# Patient Record
Sex: Female | Born: 1965 | Race: White | Hispanic: No | Marital: Married | Smoking: Never smoker
Health system: Southern US, Community
[De-identification: ages and names within clinical notes are randomized; demographics above are authoritative.]

## PROBLEM LIST (undated history)

## (undated) DIAGNOSIS — M542 Cervicalgia: Secondary | ICD-10-CM

## (undated) DIAGNOSIS — M1990 Unspecified osteoarthritis, unspecified site: Secondary | ICD-10-CM

## (undated) DIAGNOSIS — IMO0002 Reserved for concepts with insufficient information to code with codable children: Secondary | ICD-10-CM

## (undated) DIAGNOSIS — Z8739 Personal history of other diseases of the musculoskeletal system and connective tissue: Secondary | ICD-10-CM

## (undated) HISTORY — DX: Personal history of other diseases of the musculoskeletal system and connective tissue: Z87.39

## (undated) HISTORY — PX: MOUTH SURGERY: SHX715

## (undated) HISTORY — PX: SPINE SURGERY: SHX786

## (undated) HISTORY — DX: Unspecified osteoarthritis, unspecified site: M19.90

## (undated) HISTORY — DX: Reserved for concepts with insufficient information to code with codable children: IMO0002

## (undated) HISTORY — DX: Cervicalgia: M54.2

---

## 1998-02-11 ENCOUNTER — Other Ambulatory Visit: Admission: RE | Admit: 1998-02-11 | Discharge: 1998-02-11 | Payer: Self-pay | Admitting: Obstetrics and Gynecology

## 1998-02-13 ENCOUNTER — Ambulatory Visit (HOSPITAL_COMMUNITY): Admission: RE | Admit: 1998-02-13 | Discharge: 1998-02-13 | Payer: Self-pay | Admitting: Obstetrics and Gynecology

## 2001-04-06 ENCOUNTER — Other Ambulatory Visit: Admission: RE | Admit: 2001-04-06 | Discharge: 2001-04-06 | Payer: Self-pay | Admitting: Family Medicine

## 2012-06-25 DIAGNOSIS — M549 Dorsalgia, unspecified: Secondary | ICD-10-CM | POA: Insufficient documentation

## 2012-09-13 ENCOUNTER — Other Ambulatory Visit: Payer: Self-pay | Admitting: Gastroenterology

## 2012-09-13 DIAGNOSIS — R109 Unspecified abdominal pain: Secondary | ICD-10-CM

## 2012-09-18 ENCOUNTER — Ambulatory Visit
Admission: RE | Admit: 2012-09-18 | Discharge: 2012-09-18 | Disposition: A | Discharge status: Home / Self Care | Payer: No Typology Code available for payment source | Source: Ambulatory Visit | Attending: Gastroenterology | Admitting: Gastroenterology

## 2012-09-18 DIAGNOSIS — R109 Unspecified abdominal pain: Secondary | ICD-10-CM

## 2012-09-18 MED ORDER — IOHEXOL 300 MG/ML  SOLN
100.0000 mL | Freq: Once | INTRAMUSCULAR | Status: AC | PRN
  Administered 2012-09-18: 100 mL via INTRAVENOUS

## 2012-12-27 ENCOUNTER — Ambulatory Visit: Payer: Self-pay | Admitting: Neurology

## 2013-01-04 ENCOUNTER — Ambulatory Visit: Payer: Self-pay | Admitting: Neurology

## 2013-01-16 ENCOUNTER — Ambulatory Visit (INDEPENDENT_AMBULATORY_CARE_PROVIDER_SITE_OTHER): Payer: Self-pay | Admitting: Neurology

## 2013-01-16 ENCOUNTER — Encounter: Payer: Self-pay | Admitting: Neurology

## 2013-01-16 VITALS — BP 117/74 | HR 82 | Temp 99.0°F | Ht 63.0 in | Wt 151.0 lb

## 2013-01-16 DIAGNOSIS — M542 Cervicalgia: Secondary | ICD-10-CM | POA: Insufficient documentation

## 2013-01-16 DIAGNOSIS — M5030 Other cervical disc degeneration, unspecified cervical region: Secondary | ICD-10-CM

## 2013-01-16 HISTORY — DX: Cervicalgia: M54.2

## 2013-01-16 NOTE — Patient Instructions (Addendum)
Neck pain patient ;  Exercises : do not lift over 12 pounds.   Muscle relaxers. You can continue to use  Flexeril .    Refer to EMG and NCS -  MRI  Pending neurosurgery evaluation.  At [facility] imaging due to payment plan.

## 2013-01-16 NOTE — Progress Notes (Signed)
[facility]  Provider:  Dr Vickey Huger Referring Provider: No ref. provider found Primary Care Physician:  Kathy Cirri, DO  Chief Complaint  Patient presents with  . New Evaluation    cervicalgia, paper referral, Verdie Drown, rm 11    HPI:  Kathy Lang is a 47 y.o. right handed , caucasian, married  female - seen here as a referral from Dr. Rexford Maus . The patient has seen this  physician in [facility] within the [facility] system for neck pain.  On 10/18/2012 x-rays of the C-spine were ordered in office ,  These   demonstrated that the patient had indeed degenerative disc disease, no fractures are noted ,but multilevel degenerative disc disease  is described most severe at between C5 and C6.  There is also spurring noted which narrows the right C5-C6 foramen . The study was read at [facility]  Imaging and Dr. Verdie Drown dictated and called the patient and explained  that the patient will be referred to neurosurgery.  He also stated that he had initiated a referral to [facility].   The patient and I are both surprised that she is today in the neurology sleep clinic, that she has definitely cervicalgia and there seems to be a bony anatomy is narrowing of the right C5-C6 for him and that could have caused her symptoms.  The patient has a past medical history of bursitis but not of arthritis. Past surgical history only of tooth extractions.  Family history her father had diabetes both parents had hypertension her father also had migraine headaches and cancer  of the lung.     Review of Systems: Out of a complete 14 system review, the patient complains of only the following symptoms, and all other reviewed systems are negative. Cervicalgia.   History   Social History  . Marital Status: Single    Spouse Name: N/A    Number of Children: 4  . Years of Education: 12   Occupational History  .      not employed   Social History Main Topics  . Smoking status:  Never Smoker   . Smokeless tobacco: Not on file  . Alcohol Use: No  . Drug Use: No  . Sexually Active: Not on file   Other Topics Concern  . Not on file   Social History Narrative  . No narrative on file    Family History  Problem Relation Age of Onset  . Hypertension Mother   . Diabetes Father   . Hypertension Father   . Migraines Father   . Cancer Maternal Grandmother     breast    Past Medical History  Diagnosis Date  . Personal history of other musculoskeletal disorders(V13.59)   . Osteoarthritis     hip  . DDD (degenerative disc disease)     Past Surgical History  Procedure Laterality Date  . Mouth surgery      tooth extraction    Current Outpatient Prescriptions  Medication Sig Dispense Refill  . Multiple Vitamin (MULTIVITAMIN) tablet Take 1 tablet by mouth daily.      . Omega-3 Fatty Acids (FISH OIL) 1000 MG CAPS Take by mouth daily.       No current facility-administered medications for this visit.    Allergies as of 01/16/2013 - Review Complete 01/16/2013  Allergen Reaction Noted  . Penicillins  01/16/2013    Vitals: BP 117/74  Pulse 82  Temp(Src) 99 F (37.2 C) (Oral)  Ht 5\' 3"  (1.6 m)  Wt  151 lb (68.493 kg)  BMI 26.76 kg/m2 Last Weight:  Wt Readings from Last 1 Encounters:  01/16/13 151 lb (68.493 kg)   Last Height:   Ht Readings from Last 1 Encounters:  01/16/13 5\' 3"  (1.6 m)     Physical exam:  General: The patient is awake, alert and appears not in acute distress. The patient is well groomed. Head: Normocephalic, atraumatic. Neck is supple. Mallampati 1 , intact oral cavity, no inflammatory changes . Neck circumference: 15 . No TMJ , no delayed swallowing , dysphagia.  Cardiovascular:  Regular rate and rhythm, without  murmurs or carotid bruit, and without distended neck veins. Respiratory: Lungs are clear to auscultation. Skin:  Without evidence of edema, or rash Trunk: BMI is elevated / patient  has normal  posture.  Neurologic exam : The patient is awake and alert, oriented to place and time.  Memory subjective  described as intact. There is a normal attention span & concentration ability. Speech is fluent without dysarthria, dysphonia or aphasia. Mood and affect are appropriate.  Cranial nerves: Pupils are equal and briskly reactive to light. Funduscopic exam without  evidence of pallor or edema. Extraocular movements  in vertical and horizontal planes intact and without nystagmus. Visual fields by finger perimetry are intact. Hearing to finger rub intact.  Facial sensation intact to fine touch. Facial motor strength is symmetric and tongue and uvula move midline.  Motor exam:   Normal tone and normal muscle bulk and symmetric  strength in all extremities. She  Has noted weakness in grip strength bilaterally . Indeed her grip was right a little weaker than left. No finger numbness or weakness.   Her biceps flexion on the right is weaker.   Sensory:  Fine touch, pinprick and vibration were tested in all extremities. The patient reports a numb area over the right d shoulder blade up to the  paraspinal level C6 on the right.   Proprioception is tested in the upper extremities only. This was  normal.  Coordination: Rapid alternating movements in the fingers/hands is tested and normal. Finger-to-nose maneuver tested and normal without evidence of ataxia, dysmetria or tremor.  Gait and station: Patient walks without assistive device  to the exam table. Strength within normal limits. Stance is stable and normal. Tandem gait unfragmented. Romberg testing is normal.  Deep tendon reflexes: in the  upper and lower extremities intact. The right patella was brisker than left, achilles is equal. Babinski maneuver response is bilaterally downgoing.   Assessment:  After physical and neurologic examination, review of laboratory studies, imaging, neurophysiology testing and pre-existing records, assessment will be  reviewed on the problem list.  Plan:  Treatment plan and additional workup will be reviewed under Problem List.   The patient will be referred for a EMG and NCS of the upper extremities and MRI cervical spine.  She is uninsured and will need to make a payment plan.

## 2013-01-19 ENCOUNTER — Telehealth: Payer: Self-pay | Admitting: Neurology

## 2013-01-19 NOTE — Telephone Encounter (Signed)
She was under the impression that she was going to be referred for an MRI but got a call from a neurosurgeons office.  She is a little confused.  Please call.

## 2013-01-22 NOTE — Telephone Encounter (Signed)
Voice message left requesting a call-back. 

## 2013-01-23 NOTE — Telephone Encounter (Signed)
Pt stated that Dr Bluford Kaufmann' referral should have been for neurology and not a neurosurgeon.  She does not want to see a neurosurgeon at this time but possibly after she has had her MRI and NCS.  Pt is self-insured and requested that MRI be done at [facility] due to cost however she was referred to [facility] for her MRI: I transferred her to [facility] so that she may get more information regarding an MRI done at [facility].

## 2013-01-23 NOTE — Telephone Encounter (Signed)
Please call patient, she had been referred to NS per dr Bluford Kaufmann, the order was than changed by hand to neurology. I ordered both, MRI at [facility] imaging due to costs( she has a payment plan with the hospital ) and  NS consult.

## 2013-01-26 ENCOUNTER — Ambulatory Visit (INDEPENDENT_AMBULATORY_CARE_PROVIDER_SITE_OTHER): Payer: Self-pay | Admitting: Neurology

## 2013-01-26 ENCOUNTER — Encounter (INDEPENDENT_AMBULATORY_CARE_PROVIDER_SITE_OTHER): Payer: Self-pay

## 2013-01-26 DIAGNOSIS — R202 Paresthesia of skin: Secondary | ICD-10-CM | POA: Insufficient documentation

## 2013-01-26 DIAGNOSIS — Z0289 Encounter for other administrative examinations: Secondary | ICD-10-CM

## 2013-01-26 DIAGNOSIS — R209 Unspecified disturbances of skin sensation: Secondary | ICD-10-CM

## 2013-01-26 DIAGNOSIS — M5030 Other cervical disc degeneration, unspecified cervical region: Secondary | ICD-10-CM

## 2013-01-26 DIAGNOSIS — M542 Cervicalgia: Secondary | ICD-10-CM

## 2013-01-26 NOTE — Procedures (Signed)
    [facility]  NCS (NERVE CONDUCTION STUDY) WITH EMG (ELECTROMYOGRAPHY) REPORT   STUDY DATE: June 20th 2014 PATIENT NAME: Kathy Lang DOB: Jun 05, 1966 MRN: [id number]    TECHNOLOGIST: Gearldine Shown ELECTROMYOGRAPHER: Levert Feinstein M.D.  CLINICAL INFORMATION:   47 years old right-handed Caucasian female, with neck pain, since December 2013, radiating to right shoulder, and arm,   Examinations: Bilateral upper and lower extremity motor strength was normal, deep tendon reflexes were brisk and symmetric.  FINDINGS: NERVE CONDUCTION STUDY: Bilateral median, ulnar sensory and motor responses were normal.  NEEDLE ELECTROMYOGRAPHY: Selected needle examination was performed at right upper extremity muscles, right cervical paraspinals.  Needle examination of right pronator teres, brachioradialis, biceps, triceps, deltoid, extensor digitorum communis was normal.  There was no spontaneous activity at right cervical paraspinal muscles, right C5, C6, C7  IMRESSION: This is a normal study. There is no electrodiagnostic evidence of right upper extremity neuropathy, or right cervical radiculopathy.   INTERPRETING PHYSICIAN:   Levert Feinstein M.D. Ph.D. [facility] [address], [address] [place], [place] [address] [phone]

## 2013-01-29 ENCOUNTER — Encounter: Payer: Self-pay | Admitting: Neurology

## 2013-01-29 NOTE — Progress Notes (Signed)
Quick Note:  Please call with normal result. ______

## 2013-01-30 ENCOUNTER — Telehealth: Payer: Self-pay

## 2013-01-30 NOTE — Telephone Encounter (Signed)
Tried calling patient w/ NCV/EMG results. HP# busy, Mobile# does not id patient. Will try later.

## 2013-01-30 NOTE — Telephone Encounter (Signed)
Message copied by Doree Barthel on Tue Jan 30, 2013 10:38 AM ------      Message from: [facility], CARMEN      Created: Mon Jan 29, 2013 12:05 PM       Please call with normal result. ------

## 2013-01-31 ENCOUNTER — Telehealth: Payer: Self-pay

## 2013-01-31 NOTE — Telephone Encounter (Signed)
Called patient. Gave results

## 2013-01-31 NOTE — Telephone Encounter (Signed)
Message copied by Doree Barthel on Wed Jan 31, 2013  1:27 PM ------      Message from: [facility], CARMEN      Created: Mon Jan 29, 2013 12:05 PM       Please call with normal result. ------

## 2013-02-01 ENCOUNTER — Ambulatory Visit
Admission: RE | Admit: 2013-02-01 | Discharge: 2013-02-01 | Disposition: A | Discharge status: Home / Self Care | Payer: No Typology Code available for payment source | Source: Ambulatory Visit | Attending: Neurology | Admitting: Neurology

## 2013-02-01 DIAGNOSIS — M542 Cervicalgia: Secondary | ICD-10-CM

## 2013-02-01 DIAGNOSIS — M5030 Other cervical disc degeneration, unspecified cervical region: Secondary | ICD-10-CM

## 2013-02-06 ENCOUNTER — Telehealth: Payer: Self-pay | Admitting: Neurology

## 2013-02-06 NOTE — Telephone Encounter (Signed)
Bone spur affecting the right C5-6 nerve exit foramen.  Please call and ask her if she wants to try injectio therapy , PT or referral to neurosurgeon.

## 2013-02-07 ENCOUNTER — Telehealth: Payer: Self-pay | Admitting: Neurology

## 2013-02-08 ENCOUNTER — Other Ambulatory Visit: Payer: Self-pay | Admitting: Family Medicine

## 2013-02-08 DIAGNOSIS — M542 Cervicalgia: Secondary | ICD-10-CM

## 2013-02-08 NOTE — Telephone Encounter (Signed)
Spoke to patient. Explained Dr. Gay Filler previous note. Patient says she will do more research on treatment options and then call us back. Request MRI report mailed.

## 2013-02-12 ENCOUNTER — Ambulatory Visit
Admission: RE | Admit: 2013-02-12 | Discharge: 2013-02-12 | Disposition: A | Discharge status: Home / Self Care | Payer: No Typology Code available for payment source | Source: Ambulatory Visit | Attending: Family Medicine | Admitting: Family Medicine

## 2013-02-12 DIAGNOSIS — M542 Cervicalgia: Secondary | ICD-10-CM

## 2013-02-28 ENCOUNTER — Encounter (INDEPENDENT_AMBULATORY_CARE_PROVIDER_SITE_OTHER): Payer: Self-pay | Admitting: General Surgery

## 2013-03-05 NOTE — Telephone Encounter (Signed)
Previous note says patient will explore options and then get back to [place].

## 2013-12-06 DIAGNOSIS — M7070 Other bursitis of hip, unspecified hip: Secondary | ICD-10-CM | POA: Insufficient documentation

## 2014-02-28 ENCOUNTER — Other Ambulatory Visit: Payer: Self-pay | Admitting: Orthopedic Surgery

## 2014-02-28 DIAGNOSIS — M545 Low back pain, unspecified: Secondary | ICD-10-CM

## 2014-03-05 ENCOUNTER — Ambulatory Visit
Admission: RE | Admit: 2014-03-05 | Discharge: 2014-03-05 | Disposition: A | Discharge status: Home / Self Care | Payer: No Typology Code available for payment source | Source: Ambulatory Visit | Attending: Orthopedic Surgery | Admitting: Orthopedic Surgery

## 2014-03-05 DIAGNOSIS — M545 Low back pain, unspecified: Secondary | ICD-10-CM

## 2016-02-04 DIAGNOSIS — M5000 Cervical disc disorder with myelopathy, unspecified cervical region: Secondary | ICD-10-CM | POA: Insufficient documentation

## 2016-03-08 DIAGNOSIS — M818 Other osteoporosis without current pathological fracture: Secondary | ICD-10-CM | POA: Insufficient documentation

## 2017-09-06 DIAGNOSIS — R159 Full incontinence of feces: Secondary | ICD-10-CM | POA: Insufficient documentation

## 2017-09-06 DIAGNOSIS — R152 Fecal urgency: Secondary | ICD-10-CM | POA: Insufficient documentation

## 2017-09-08 ENCOUNTER — Other Ambulatory Visit: Payer: Self-pay | Admitting: Orthopedic Surgery

## 2017-09-08 DIAGNOSIS — M542 Cervicalgia: Secondary | ICD-10-CM

## 2017-09-14 ENCOUNTER — Ambulatory Visit
Admission: RE | Admit: 2017-09-14 | Discharge: 2017-09-14 | Disposition: A | Discharge status: Home / Self Care | Payer: No Typology Code available for payment source | Source: Ambulatory Visit | Attending: Orthopedic Surgery | Admitting: Orthopedic Surgery

## 2017-09-14 DIAGNOSIS — M542 Cervicalgia: Secondary | ICD-10-CM

## 2017-10-10 DIAGNOSIS — K529 Noninfective gastroenteritis and colitis, unspecified: Secondary | ICD-10-CM | POA: Insufficient documentation

## 2019-12-15 IMAGING — CT CT CERVICAL SPINE W/O CM
2 series · 10 of 14 positions shown, 12 images · non-contrast
Comparison: Cervical spine x-rays 01/26/2017.  MRI 01/16/2016

CLINICAL DATA: Cervicalgia.  Cervical fusion 5284.

EXAM:
CT CERVICAL SPINE WITHOUT CONTRAST
TECHNIQUE: Multidetector CT imaging of the cervical spine was performed without
intravenous contrast. Multiplanar CT image reconstructions were also
generated.

[Series 4: c_spine 2.0 i41s 3 · axial · 0.25mm/px · z∈[-222,-94]mm · 5 of 98 slices shown, 7 images]
[im 17/98  soft-tissue]
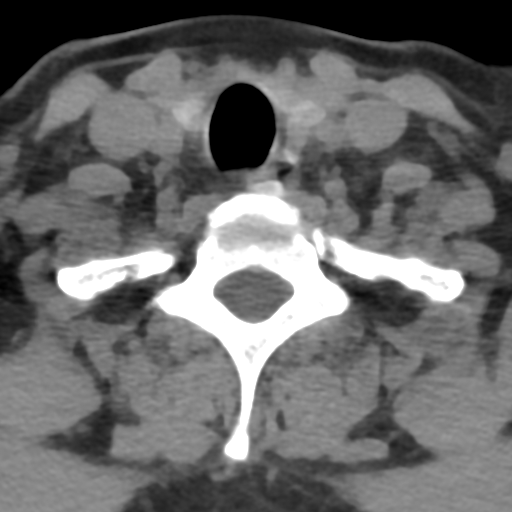
[im 17/98  bone]
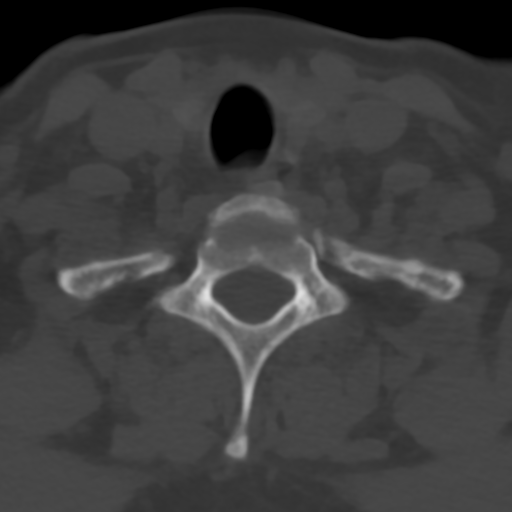
[im 33/98  bone]
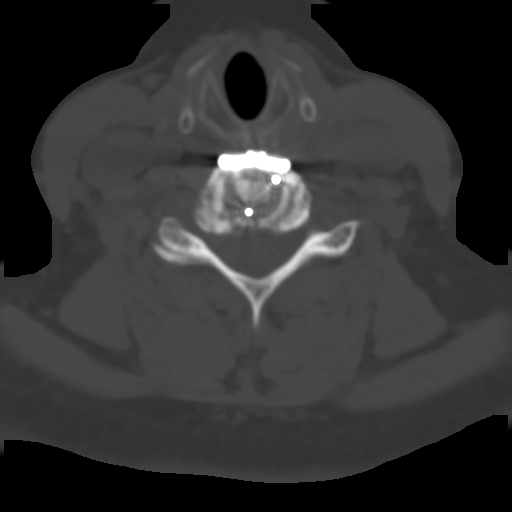
[im 49/98  bone]
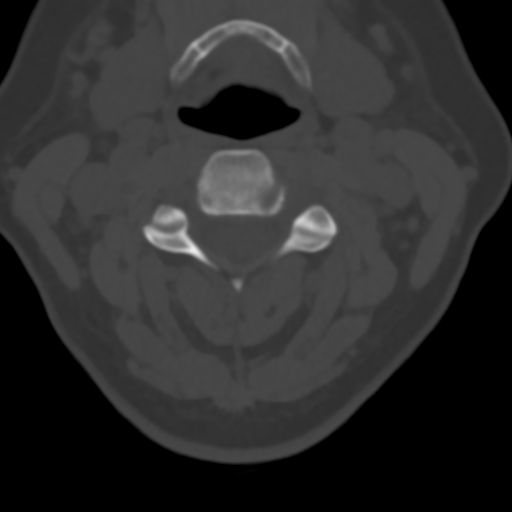
[im 65/98  bone]
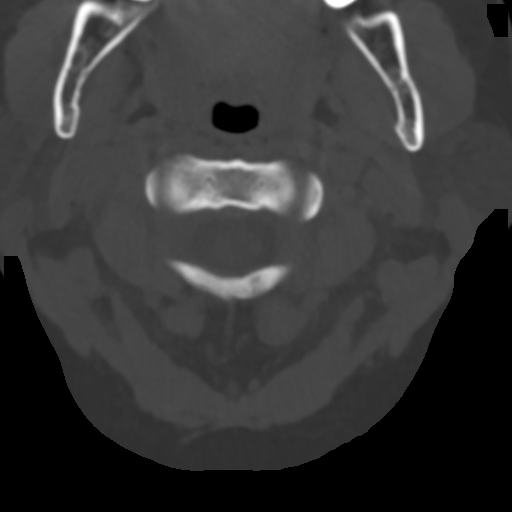
[im 81/98  soft-tissue]
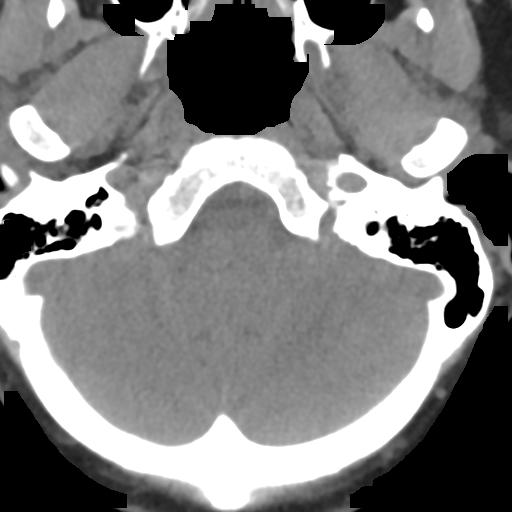
[im 81/98  bone]
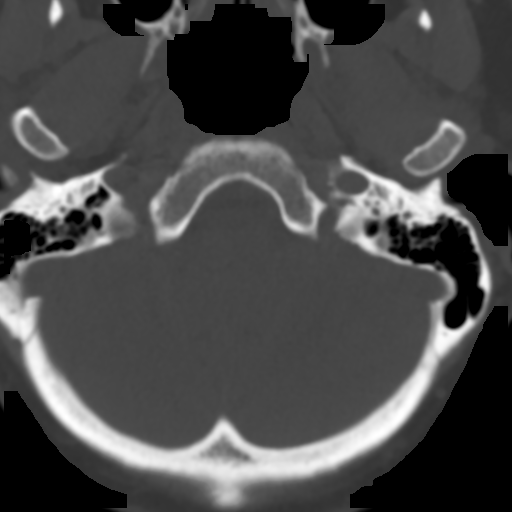

[Series 12: angled axials · axial · 0.23mm/px · z∈[-234,-107]mm · 5 of 99 slices shown]
[im 17/99  bone]
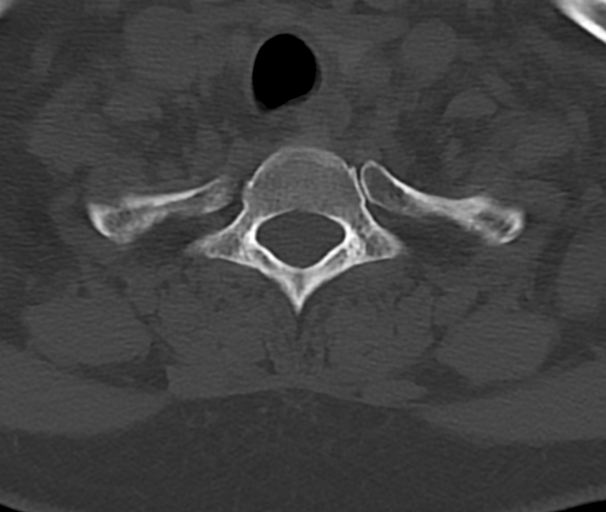
[im 33/99  bone]
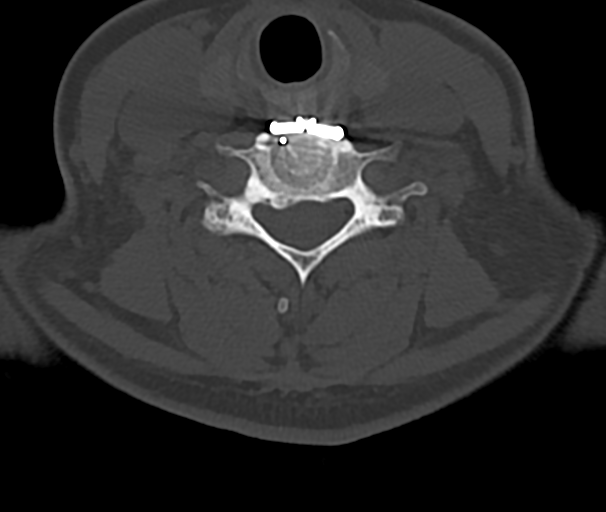
[im 50/99  bone]
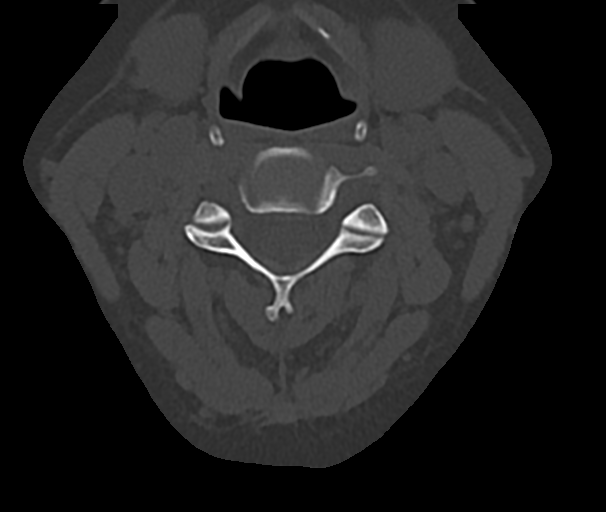
[im 66/99  bone]
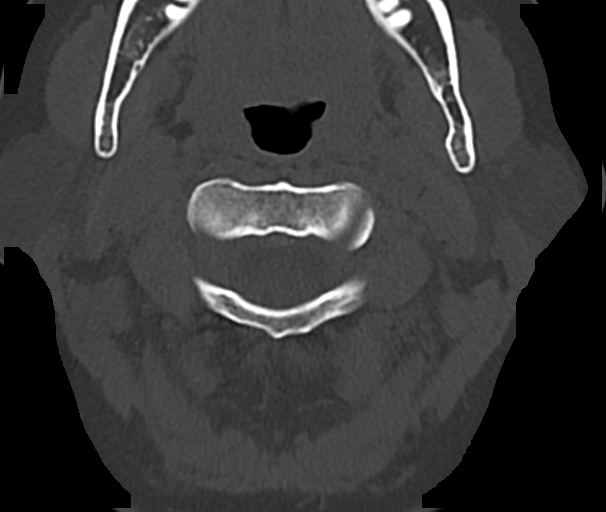
[im 82/99  bone]
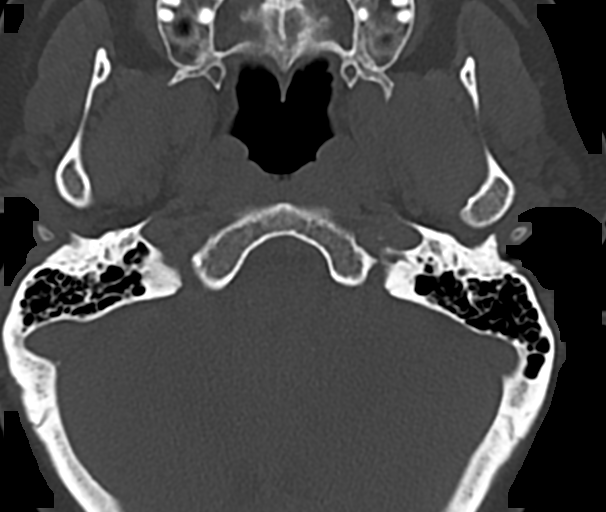

[10 of 14 positions shown; findings below may reference images not displayed]

FINDINGS: Alignment: Normal

Skull base and vertebrae: Negative for fracture or mass.

Soft tissues and spinal canal: Negative for mass or adenopathy in
the neck.

Disc levels:  C2-3: Negative

C3-4: Negative

C4-5: Negative

C5-6: ACDF. Anterior plate and screws in good position. Persistent
lucency through the bone graft compatible with pseudarthrosis.
Moderate right foraminal encroachment due to a large osteophyte.
Mild left foraminal narrowing due to spurring.

C6-7: Small central disc protrusion. Mild uncinate spurring
diffusely with mild foraminal stenosis bilaterally.

C7-T1: Negative

Upper chest: Negative

Other: None
IMPRESSION: ACDF C5-6 with pseudarthrosis. Large right-sided osteophyte causing
moderate right foraminal encroachment and mild left foraminal
encroachment

Small central disc protrusion C6-7 unchanged from the prior MRI.
Mild foraminal narrowing bilaterally due to spurring also unchanged
from the prior MRI.

## 2020-03-21 DIAGNOSIS — M5134 Other intervertebral disc degeneration, thoracic region: Secondary | ICD-10-CM | POA: Insufficient documentation

## 2020-08-20 ENCOUNTER — Telehealth: Payer: Self-pay

## 2020-08-21 ENCOUNTER — Other Ambulatory Visit: Payer: Self-pay

## 2020-08-21 ENCOUNTER — Inpatient Hospital Stay: Payer: Self-pay | Attending: Hematology and Oncology | Admitting: Hematology and Oncology

## 2020-08-21 VITALS — BP 104/67 | HR 67 | Temp 98.6°F | Resp 18 | Wt 153.3 lb

## 2020-08-21 DIAGNOSIS — Z1239 Encounter for other screening for malignant neoplasm of breast: Secondary | ICD-10-CM

## 2020-08-21 NOTE — Progress Notes (Signed)
Ms. Kathy Lang is a 55 y.o. female who presents to [facility] clinic today with no complaints .    Pap Smear: Pap not smear completed today. Last Pap smear was 2019 at [facility] clinic and was normal. Per patient has no history of an abnormal Pap smear. Last Pap smear result is not available in Epic.   Physical exam: Breasts Breasts symmetrical. No skin abnormalities bilateral breasts. No nipple retraction bilateral breasts. No nipple discharge bilateral breasts. No lymphadenopathy. No lumps palpated bilateral breasts.       Pelvic/Bimanual Pap is not indicated today    Smoking History: Patient has never smoked     Patient Navigation: Patient education provided. Access to services provided for patient through [facility] program.    Colorectal Cancer Screening: Per patient has had colonoscopy completed on 2019 No complaints today.    Breast and Cervical Cancer Risk Assessment: Patient does not have family history of breast cancer, known genetic mutations, or radiation treatment to the chest before age 60. Patient does not have history of cervical dysplasia, immunocompromised, or DES exposure in-utero.  Risk Assessment    Risk Scores      08/21/2020   Last edited by: Dyane Dustman, RN   5-year risk:    Lifetime risk:           A: [facility] exam without pap smear Taught [facility] HOW[employer]ER how to perform BSE and gave educational materials to take home. Patient did not need a Pap smear today due to last Pap smear was in 2019 per patient. Told patient about free cervical cancer screenings to receive a Pap smear if would like one next year. Let her know [facility] will cover Pap smears every 3 years unless has a history of abnormal Pap smears. Referred patient to [facility] for diagnostic mammogram.  Let patient know will follow up with her within the next couple weeks with results. Kathy Lang verbalized understanding.  Pascal Lux, NP 08/21/20 3:41  PM      P: Referred patient to the Breast Center  for a screening mammogram.   Pascal Lux, NP 08/21/2020 3:41 PM

## 2020-09-04 NOTE — Progress Notes (Unsigned)
Pt received a screening mammogram on 08/29/2020 at [facility] through the [facility] program with negative results. Results are mailed directly to the patient.

## 2020-09-06 ENCOUNTER — Encounter: Payer: Self-pay | Admitting: Hematology and Oncology

## 2020-09-19 ENCOUNTER — Ambulatory Visit
Admission: RE | Admit: 2020-09-19 | Discharge: 2020-09-19 | Disposition: A | Discharge status: Home / Self Care | Payer: Self-pay | Source: Ambulatory Visit | Attending: Family Medicine | Admitting: Family Medicine

## 2020-09-19 ENCOUNTER — Other Ambulatory Visit: Payer: Self-pay

## 2020-09-19 ENCOUNTER — Other Ambulatory Visit: Payer: Self-pay | Admitting: Family Medicine

## 2020-09-19 DIAGNOSIS — R059 Cough, unspecified: Secondary | ICD-10-CM

## 2020-09-19 DIAGNOSIS — M549 Dorsalgia, unspecified: Secondary | ICD-10-CM

## 2020-09-30 ENCOUNTER — Ambulatory Visit: Payer: Self-pay | Admitting: Physician Assistant

## 2020-12-01 ENCOUNTER — Encounter: Payer: Self-pay | Admitting: Physician Assistant

## 2020-12-01 ENCOUNTER — Ambulatory Visit (INDEPENDENT_AMBULATORY_CARE_PROVIDER_SITE_OTHER): Payer: Self-pay | Admitting: Physician Assistant

## 2020-12-01 ENCOUNTER — Other Ambulatory Visit: Payer: Self-pay

## 2020-12-01 VITALS — BP 136/84 | HR 84 | Temp 97.3°F | Ht 62.0 in | Wt 160.0 lb

## 2020-12-01 DIAGNOSIS — M546 Pain in thoracic spine: Secondary | ICD-10-CM

## 2020-12-01 DIAGNOSIS — R6882 Decreased libido: Secondary | ICD-10-CM

## 2020-12-01 DIAGNOSIS — G8929 Other chronic pain: Secondary | ICD-10-CM

## 2020-12-01 MED ORDER — MELOXICAM 7.5 MG PO TABS: 7.5000 mg | ORAL_TABLET | Freq: Every day | ORAL | 3 refills | Status: DC

## 2020-12-01 NOTE — Progress Notes (Signed)
New Patient Office Visit  Subjective:  Patient ID: Kathy Lang, female    DOB: [birth year]  Age: 55 y.o. MRN: [id number]  CC:  Chief Complaint  Patient presents with  . Decreased libido Thoracic back pain    HPI Kathy Lang presents for decreased libido - she states that she has had this issue for more than 10 years - her husband told her to 'get hormones checked' Pt states to me and nurse her reasoning for decreased libido is due to their poor relationship She actually has been postmenopausal for 5 years which she is told this could be the issue and would not necessarily checking hormone levels (because pt states she did not want to take hormone replacement therapy) but is agreeable to check TSH Patient mentions her vagina appears darker than it used to be and wanted checked  Pt states she has had mid thoracic back pain for the past year - she has seen ortho and had MRI of thoracic spine which she states showed mild disc bulging but no stenosis - complains of pain in mid back with certain movements  Past Medical History:  Diagnosis Date  . Cervicalgia 01/16/2013   Dr Carin Hock ENT referred to [facility] for neurosurgical evaluation.   . DDD (degenerative disc disease)   . Osteoarthritis    hip  . Personal history of other musculoskeletal disorders(V13.59)     Past Surgical History:  Procedure Laterality Date  . MOUTH SURGERY     tooth extraction  . SPINE SURGERY      Family History  Problem Relation Age of Onset  . Hypertension Mother   . Diabetes Father   . Hypertension Father   . Migraines Father   . Cancer Maternal Grandmother        breast    Social History   Socioeconomic History  . Marital status: Married    Spouse name: Kathy Lang  . Number of children: 4  . Years of education: 23  . Highest education level: Not on file  Occupational History    Comment: not employed  Tobacco Use  . Smoking status: Never Smoker  . Smokeless tobacco: Never Used  Substance  and Sexual Activity  . Alcohol use: No  . Drug use: No  . Sexual activity: Not on file  Other Topics Concern  . Not on file  Social History Narrative  . Not on file   Social Determinants of Health   Financial Resource Strain: Not on file  Food Insecurity: Not on file  Transportation Needs: Not on file  Physical Activity: Not on file  Stress: Not on file  Social Connections: Not on file  Intimate Partner Violence: Not on file     Current Outpatient Medications:  .  meloxicam (MOBIC) 7.5 MG tablet, Take 1 tablet (7.5 mg total) by mouth daily., Disp: 30 tablet, Rfl: 3 .  ZINC ACETATE EX, Apply topically., Disp: , Rfl:  .  Zinc Acetate, Oral, (ZINC ACETATE PO), Take by mouth., Disp: , Rfl:  .  ascorbic acid (VITAMIN C) 1000 MG tablet, Take by mouth., Disp: , Rfl:  .  Cholecalciferol 75 MCG (3000 UT) TABS, Take by mouth., Disp: , Rfl:  .  Multiple Vitamin (MULTIVITAMIN) tablet, Take 1 tablet by mouth daily., Disp: , Rfl:  .  Omega-3 Fatty Acids (FISH OIL) 1000 MG CAPS, Take by mouth daily., Disp: , Rfl:    Allergies  Allergen Reactions  . Penicillins     Swelling  of hands and feet    ROS CONSTITUTIONAL: see HPI CARDIOVASCULAR: Negative for chest pain, dizziness, palpitations and pedal edema.  RESPIRATORY: Negative for recent cough and dyspnea.  GASTROINTESTINAL: Negative for abdominal pain, acid reflux symptoms, constipation, diarrhea, nausea and vomiting.  GU - see HPI MSK: see HPI INTEGUMENTARY: Negative for rash.         Objective:    PHYSICAL EXAM:   VS: BP 136/84   Pulse 84   Temp (!) 97.3 F (36.3 C)   Ht 5' 2"  (1.575 m)   Wt 160 lb (72.6 kg)   LMP 08/23/2012   SpO2 99%   BMI 29.26 kg/m   GEN: Well nourished, well developed, in no acute distress  Cardiac: RRR; no murmurs, rubs, or gallops,no edema - no significant varicosities Respiratory:  normal respiratory rate and pattern with no distress - normal breath sounds with no rales, rhonchi, wheezes or  rubs MS: no deformity or atrophy - tender to right side back (thoracic) Skin: warm and dry, no rash - ext genitalia has generalized darkening (hormonal changes) and one small area that appears to be healing excoriated area Psych: euthymic mood, appropriate affect and demeanor  BP 136/84   Pulse 84   Temp (!) 97.3 F (36.3 C)   Ht 5' 2"  (1.575 m)   Wt 160 lb (72.6 kg)   LMP 08/23/2012   SpO2 99%   BMI 29.26 kg/m  Wt Readings from Last 3 Encounters:  12/01/20 160 lb (72.6 kg)  08/21/20 153 lb 4.8 oz (69.5 kg)  01/16/13 151 lb (68.5 kg)     Health Maintenance Due  Topic Date Due  . Hepatitis C Screening  Never done  . HIV Screening  Never done  . TETANUS/TDAP  Never done  . PAP SMEAR-Modifier  Never done  . COLONOSCOPY (Pts 45-49yr Insurance coverage will need to be confirmed)  Never done    There are no preventive care reminders to display for this patient.  No results found for: TSH No results found for: WBC, HGB, HCT, MCV, PLT No results found for: NA, K, CHLORIDE, CO2, GLUCOSE, BUN, CREATININE, BILITOT, ALKPHOS, AST, ALT, PROT, ALBUMIN, CALCIUM, ANIONGAP, EGFR, GFR No results found for: CHOL No results found for: HDL No results found for: LDLCALC No results found for: TRIG No results found for: CHOLHDL No results found for: HGBA1C    Assessment & Plan:   Problem List Items Addressed This Visit      Other   Decreased libido - Primary Order given for TSH Also mention to follow up if excoriated area in vaginal area does not clear in 2-3 weeks   Chronic right-sided thoracic back pain       Meds ordered this encounter  Medications  . meloxicam (MOBIC) 7.5 MG tablet    Sig: Take 1 tablet (7.5 mg total) by mouth daily.    Dispense:  30 tablet    Refill:  3    Order Specific Question:   Supervising Provider    Answer:Shelton Silvas   Follow-up: Return if symptoms worsen or fail to improve.    SARA R Rovena Hearld, PA-C

## 2021-02-10 LAB — TSH: TSH: 0.99 (ref <=5.90)

## 2021-02-16 ENCOUNTER — Encounter: Payer: Self-pay | Admitting: Physician Assistant

## 2021-02-19 ENCOUNTER — Encounter: Payer: Self-pay | Admitting: Physician Assistant

## 2021-05-07 ENCOUNTER — Ambulatory Visit: Payer: Self-pay | Admitting: Podiatry

## 2021-05-19 ENCOUNTER — Other Ambulatory Visit: Payer: Self-pay

## 2021-05-19 ENCOUNTER — Ambulatory Visit (INDEPENDENT_AMBULATORY_CARE_PROVIDER_SITE_OTHER): Payer: Self-pay

## 2021-05-19 ENCOUNTER — Encounter: Payer: Self-pay | Admitting: Sports Medicine

## 2021-05-19 ENCOUNTER — Ambulatory Visit (INDEPENDENT_AMBULATORY_CARE_PROVIDER_SITE_OTHER): Payer: Self-pay | Admitting: Sports Medicine

## 2021-05-19 DIAGNOSIS — M79671 Pain in right foot: Secondary | ICD-10-CM

## 2021-05-19 DIAGNOSIS — M79672 Pain in left foot: Secondary | ICD-10-CM

## 2021-05-19 DIAGNOSIS — R202 Paresthesia of skin: Secondary | ICD-10-CM

## 2021-05-19 DIAGNOSIS — M779 Enthesopathy, unspecified: Secondary | ICD-10-CM

## 2021-05-19 DIAGNOSIS — M792 Neuralgia and neuritis, unspecified: Secondary | ICD-10-CM

## 2021-05-19 DIAGNOSIS — M542 Cervicalgia: Secondary | ICD-10-CM

## 2021-05-19 DIAGNOSIS — M5440 Lumbago with sciatica, unspecified side: Secondary | ICD-10-CM

## 2021-05-19 MED ORDER — PREDNISONE 10 MG (21) PO TBPK: ORAL_TABLET | ORAL | 0 refills | Status: DC

## 2021-05-19 NOTE — Progress Notes (Signed)
Subjective: Kathy Lang is a 55 y.o. female patient who presents to office for evaluation of bilateral foot pain states that she gets burning and stinging sensations to the balls of both feet and to all toes 1 through 10 states that she has a similar sensation at both heels and ankles and states that there is a achy pain worse on the left ankle compared to the right with bruising and discoloration states that her feet are also very cold and she feels sharp shooting pain that feels like she is walking on rocks worse with the first step and tingling constantly through her toes even with rest has tried over-the-counter insoles, CBD cream, stretching and states that things have slowly gotten worse over the last 3 to 4 months and states that her back pain has gotten worse over the past year.  Patient denies any other pedal complaints.  Patient denies any significant past or family history of any types of inflammatory arthritis this.  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     Cholecalciferol 75 MCG (3000 UT) TABS Take by mouth.     cyclobenzaprine (FLEXERIL) 10 MG tablet Take by mouth.     meloxicam (MOBIC) 7.5 MG tablet Take 1 tablet (7.5 mg total) by mouth daily. 30 tablet 3   ZINC ACETATE EX Apply topically.     Zinc Acetate, Oral, (ZINC ACETATE PO) Take by mouth.     No current facility-administered  medications on file prior to visit.    Allergies  Allergen Reactions   Penicillins     Swelling of hands and feet    Objective:  General: Alert and oriented x3 in no acute distress  Dermatology: No open lesions bilateral lower extremities, no webspace macerations, no ecchymosis bilateral, all nails x 10 are well manicured.  Vascular: Dorsalis Pedis and Posterior Tibial pedal pulses palpable, Capillary Fill Time 3 seconds,(+) pedal hair growth bilateral, no edema bilateral lower extremities, Temperature gradient within normal limits.  Neurology: Michaell Cowing sensation intact via light touch bilateral.  Protective sensation present bilateral.  Subjective tingling sensation to all toes bilateral.  Musculoskeletal: Mild tenderness with palpation at ball greater than the heels of both feet with most pain with medial to lateral compression at the first and fifth MPJs bilateral.  There is also pain to the dorsal lateral foot and ankle left greater than right.  Strength appears to be within normal limits.  No other acute symptomatic bony deformities noted bilateral.  Xrays  Right and left foot   Impression: No acute osseous findings  Assessment and Plan: Problem List Items Addressed This Visit       Other   Cervicalgia   Paresthesia   Back pain   Relevant Medications   cyclobenzaprine (FLEXERIL) 10 MG tablet   predniSONE (STERAPRED UNI-PAK 21 TAB) 10 MG (21) TBPK tablet   Other Visit Diagnoses     Bilateral foot pain    -  Primary   Relevant Orders   DG Foot Complete Right   DG Foot Complete Left   Capsulitis       Tendinitis       Neuritis            -Complete examination performed -Xrays reviewed -Discussed treatment options foot pain possible related to neuritis versus capsulitis tendinitis -Rx prednisone for patient to take as directed -Advised patient to try over-the-counter Nervive nerve relief -Continue with good supportive shoes and OTC insoles -Continue with daily  stretching as directed  -Patient to return to office as needed or sooner if condition worsens. Advised patient since is self pay if she prefers to do a free one time virtual visit I am willing to do that to further discuss her symptoms if no relief from what I have recommended.  Asencion Islam, DPM

## 2021-05-19 NOTE — Patient Instructions (Signed)
Nervive nerve relief supplement for your nerves can be purchased OTC at [facility]  

## 2021-05-22 ENCOUNTER — Other Ambulatory Visit: Payer: Self-pay | Admitting: Sports Medicine

## 2021-05-22 DIAGNOSIS — M779 Enthesopathy, unspecified: Secondary | ICD-10-CM

## 2021-06-05 ENCOUNTER — Encounter: Payer: Self-pay | Admitting: Sports Medicine

## 2021-06-05 ENCOUNTER — Ambulatory Visit (INDEPENDENT_AMBULATORY_CARE_PROVIDER_SITE_OTHER): Payer: Self-pay | Admitting: Sports Medicine

## 2021-06-05 DIAGNOSIS — M79671 Pain in right foot: Secondary | ICD-10-CM

## 2021-06-05 DIAGNOSIS — M779 Enthesopathy, unspecified: Secondary | ICD-10-CM

## 2021-06-05 DIAGNOSIS — M792 Neuralgia and neuritis, unspecified: Secondary | ICD-10-CM

## 2021-06-05 DIAGNOSIS — R202 Paresthesia of skin: Secondary | ICD-10-CM

## 2021-06-05 DIAGNOSIS — M5440 Lumbago with sciatica, unspecified side: Secondary | ICD-10-CM

## 2021-06-05 DIAGNOSIS — M79672 Pain in left foot: Secondary | ICD-10-CM

## 2021-06-05 NOTE — Progress Notes (Signed)
Virtual Visit via Telephone Note  I connected with MALKY RUDZINSKI on 06/05/21 at  8:00 AM EDT by telephone and verified that I am speaking with the correct person using two identifiers.  Location: Patient: [facility] home Provider: Landis Martins, DPM Marrianne Mood    I discussed the limitations, risks, security and privacy concerns of performing an evaluation and management service by telephone and the availability of in person appointments. I also discussed with the patient that there may be a patient responsible charge related to this service. The patient expressed understanding and agreed to proceed.   History of Present Illness: 55 y/o F met via telephone visit to re-discuss her foot pain. Patient reports that pain was better after day 3 of the steroid pack but slowly the burning pain to both feet has returned.  Patient denies any other new pedal complaints at this time.   Observations/Objective: Physical exam unable to be performed due to telephone nature of visit.  Assessment and Plan: Problem List Items Addressed This Visit       Other   Paresthesia   Back pain   Other Visit Diagnoses     Neuritis    -  Primary   Capsulitis       Tendinitis       Bilateral foot pain          Advised patient since her symptoms have failed to improve may benefit from reevaluation of her back with her orthopedic or neurosurgeon or may benefit from a referral to neurology.  Patient wants to think about these options and will call office back when she has made a decision.  Follow Up Instructions: As above   I discussed the assessment and treatment plan with the patient. The patient was provided an opportunity to ask questions and all were answered. The patient agreed with the plan and demonstrated an understanding of the instructions.   The patient was advised to call back or seek an in-person evaluation if the symptoms worsen or if the condition fails to improve as anticipated.  I  provided 13 minutes of non-face-to-face time during this encounter.   Landis Martins, DPM

## 2021-12-16 ENCOUNTER — Encounter: Payer: Self-pay | Admitting: Physician Assistant

## 2021-12-16 ENCOUNTER — Ambulatory Visit: Payer: Self-pay | Admitting: Physician Assistant

## 2021-12-16 VITALS — BP 104/84 | HR 69 | Temp 98.2°F | Ht 63.0 in | Wt 148.8 lb

## 2021-12-16 DIAGNOSIS — M546 Pain in thoracic spine: Secondary | ICD-10-CM

## 2021-12-16 DIAGNOSIS — G8929 Other chronic pain: Secondary | ICD-10-CM

## 2021-12-16 NOTE — Assessment & Plan Note (Signed)
Recommend NSAIDs ?Will obtain chest xray ?If normal recommend follow up with ortho for further evaluation ?

## 2021-12-16 NOTE — Progress Notes (Signed)
? ?Acute Office Visit ? ?Subjective:  ? ? Patient ID: Kathy Lang, female    DOB: 23-Nov-1965, 56 y.o.   MRN: [id number] ? ?Chief Complaint  ?Patient presents with  ? Back Pain  ? ? ?HPI: ?Patient is in today for complaints of pain around thoracic spine at right shoulder blade area.  She actually has had the problem for over 2 years.  In 5/21 she had thoracic MRI which was normal per pt (report shows DJD and mild changes) and had seen Dr Katherina Right.  She was referred for a rheumatology workup which patient states was negative.  She has since seen Dr Susy Manor 9/22 and had thoracic xray done which showed DJD changes.  She has been to physical therapy and had dry needling procedures done.  She also has been seeing Dr Nelva Bush with pain management but states that was more for lower back pain.   ?She has tried multiple NSAIDs and tylenol.  States pain worse at times with laying on her side and with certain movements ?Denies dyspnea ? ?Past Medical History:  ?Diagnosis Date  ? Cervicalgia 01/16/2013  ? Dr Carin Hock ENT referred to [facility] for neurosurgical evaluation.   ? DDD (degenerative disc disease)   ? Osteoarthritis   ? hip  ? Personal history of other musculoskeletal disorders(V13.59)   ? ? ?Past Surgical History:  ?Procedure Laterality Date  ? MOUTH SURGERY    ? tooth extraction  ? SPINE SURGERY    ? ? ?Family History  ?Problem Relation Age of Onset  ? Hypertension Mother   ? Diabetes Father   ? Hypertension Father   ? Migraines Father   ? Cancer Maternal Grandmother   ?     breast  ? ? ?Social History  ? ?Socioeconomic History  ? Marital status: Married  ?  Spouse name: Vasilisa Vore  ? Number of children: 4  ? Years of education: 77  ? Highest education level: Not on file  ?Occupational History  ?  Comment: not employed  ?Tobacco Use  ? Smoking status: Never  ? Smokeless tobacco: Never  ?Substance and Sexual Activity  ? Alcohol use: No  ? Drug use: No  ? Sexual activity: Not on file  ?Other Topics Concern  ? Not on file   ?Social History Narrative  ? Not on file  ? ?Social Determinants of Health  ? ?Financial Resource Strain: Not on file  ?Food Insecurity: Not on file  ?Transportation Needs: Not on file  ?Physical Activity: Not on file  ?Stress: Not on file  ?Social Connections: Not on file  ?Intimate Partner Violence: Not on file  ? ? ?Outpatient Medications Prior to Visit  ?Medication Sig Dispense Refill  ? ascorbic acid (VITAMIN C) 1000 MG tablet Take by mouth.    ? Cholecalciferol 75 MCG (3000 UT) TABS Take by mouth.    ? ZINC ACETATE EX Apply topically.    ? cyclobenzaprine (FLEXERIL) 10 MG tablet Take by mouth.    ? meloxicam (MOBIC) 7.5 MG tablet Take 1 tablet (7.5 mg total) by mouth daily. 30 tablet 3  ? predniSONE (STERAPRED UNI-PAK 21 TAB) 10 MG (21) TBPK tablet Take as directed 21 tablet 0  ? Zinc Acetate, Oral, (ZINC ACETATE PO) Take by mouth.    ? ?No facility-administered medications prior to visit.  ? ? ?Allergies  ?Allergen Reactions  ? Penicillins   ?  Swelling of hands and feet  ? ? ?Review of Systems ?CONSTITUTIONAL: Negative for chills, fatigue,  fever, unintentional weight gain and unintentional weight loss.  ?CARDIOVASCULAR: Negative for chest pain, dizziness, ?RESPIRATORY: Negative for recent cough and dyspnea.  ?GASTROINTESTINAL: Negative for abdominal pain, acid reflux symptoms, constipation, diarrhea, nausea and vomiting.  ?MSK: see HPI ?INTEGUMENTARY: Negative for rash.  ?   ? ?   ?Objective:  ?  ?Physical Exam ?PHYSICAL EXAM:  ? ?VS: BP 104/84   Pulse 69   Temp 98.2 ?F (36.8 ?C)   Ht _0  (1.6 m)   Wt 148 lb 12.8 oz (67.5 kg)   LMP 08/23/2012   SpO2 98%   BMI 26.36 kg/m?  ? ?GEN: Well nourished, well developed, in no acute distress  ?Cardiac: RRR; no murmurs,  ?Respiratory:  normal respiratory rate and pattern with no distress - normal breath sounds with no rales, rhonchi, wheezes or rubs ? ?MS: no deformity or atrophy - palpable tenderness to mid thoracic area right of spine ?Skin: warm and dry,  no rash  ? ? ?BP 104/84   Pulse 69   Temp 98.2 ?F (36.8 ?C)   Ht _1  (1.6 m)   Wt 148 lb 12.8 oz (67.5 kg)   LMP 08/23/2012   SpO2 98%   BMI 26.36 kg/m?  ?Wt Readings from Last 3 Encounters:  ?12/16/21 148 lb 12.8 oz (67.5 kg)  ?12/01/20 160 lb (72.6 kg)  ?08/21/20 153 lb 4.8 oz (69.5 kg)  ? ? ?Health Maintenance Due  ?Topic Date Due  ? HIV Screening  Never done  ? Hepatitis C Screening  Never done  ? TETANUS/TDAP  Never done  ? PAP SMEAR-Modifier  Never done  ? COLONOSCOPY (Pts 45-71yr Insurance coverage will need to be confirmed)  Never done  ? Zoster Vaccines- Shingrix (1 of 2) Never done  ? ? ?There are no preventive care reminders to display for this patient. ? ? ?Lab Results  ?Component Value Date  ? TSH 0.99 02/10/2021  ? ?No results found for: WBC, HGB, HCT, MCV, PLT ?No results found for: NA, K, CHLORIDE, CO2, GLUCOSE, BUN, CREATININE, BILITOT, ALKPHOS, AST, ALT, PROT, ALBUMIN, CALCIUM, ANIONGAP, EGFR, GFR ?No results found for: CHOL ?No results found for: HDL ?No results found for: [place]?No results found for: TRIG ?No results found for: CHOLHDL ?No results found for: HGBA1C ? ?   ?Assessment & Plan:  ? ?Problem List Items Addressed This Visit   ? ?  ? Other  ? Chronic right-sided thoracic back pain - Primary  ?  Recommend NSAIDs ?Will obtain chest xray ?If normal recommend follow up with ortho for further evaluation ? ?  ?  ? ?No orders of the defined types were placed in this encounter. ? ? ?No orders of the defined types were placed in this encounter. ?  ? ?Follow-up: Return if symptoms worsen or fail to improve. ? ?An After Visit Summary was printed and given to the patient. ? ?SARA R Syrai Gladwin, PA-C ?[place]?([phone]?

## 2021-12-17 ENCOUNTER — Other Ambulatory Visit: Payer: Self-pay

## 2021-12-30 ENCOUNTER — Telehealth: Payer: Self-pay

## 2021-12-30 NOTE — Telephone Encounter (Signed)
Patient requesting results of chest xray done last Tuesday. She had this done at [facility].   Report in Care Everywhere. Please advise.   Lorita Officer, CCMA 12/30/21 11:51 AM

## 2022-01-05 ENCOUNTER — Encounter: Payer: Self-pay | Admitting: Physician Assistant

## 2022-01-07 ENCOUNTER — Telehealth: Payer: Self-pay

## 2022-01-07 NOTE — Telephone Encounter (Signed)
Patient is calling questioning why a CT of chest, abd, pelvis would be needed when pain is located behind her breast. She is wondering if maybe this would needed to be changed. Please advise.   She is self pay and does not want to pay for more than needed.  Lorita Officer, CCMA 01/07/22 11:35 AM

## 2022-01-08 NOTE — Telephone Encounter (Signed)
Spoke with patient. She is describing pain location "if you look at your back directly behind your right breast." Patient is to stop by office on Monday to show location.   Kathy Lang 01/08/22 12:37 PM

## 2022-01-08 NOTE — Telephone Encounter (Signed)
Pt told nurse Leia Alf) that her pain is in her RIGHT FLANK AREA and below right shoulder blade --- this is also how she explained pain to me at last office visit --- if that is where her pain is then that is the correct test ordered If not let me know

## 2022-01-18 NOTE — Telephone Encounter (Signed)
Patient was informed.

## 2022-06-17 ENCOUNTER — Encounter: Payer: Self-pay | Admitting: Physician Assistant

## 2022-06-17 ENCOUNTER — Ambulatory Visit: Payer: Self-pay | Admitting: Physician Assistant

## 2022-06-17 VITALS — BP 122/82 | HR 79 | Temp 97.2°F | Ht 63.0 in | Wt 150.0 lb

## 2022-06-17 DIAGNOSIS — J40 Bronchitis, not specified as acute or chronic: Secondary | ICD-10-CM

## 2022-06-17 MED ORDER — PREDNISONE 20 MG PO TABS: ORAL_TABLET | ORAL | 0 refills | Status: AC

## 2022-06-17 MED ORDER — AZITHROMYCIN 250 MG PO TABS: ORAL_TABLET | ORAL | 0 refills | Status: AC

## 2022-06-17 NOTE — Progress Notes (Signed)
Acute Office Visit  Subjective:    Patient ID: Kathy Lang, female    DOB: [birth year], 56 y.o.   MRN: [id number]  Chief Complaint  Patient presents with   Sinusitis    HPI: Patient is in today for complaints of cough, sore throat and congestion for over a week.  States at times cough has been productive.  Denies fever or malaise .  Took home COVID test last week and today which were negative.  Is using mucinex at home which does help with cough  Past Medical History:  Diagnosis Date   Cervicalgia 01/16/2013   Dr Carin Hock ENT referred to [facility] for neurosurgical evaluation.    DDD (degenerative disc disease)    Osteoarthritis    hip   Personal history of other musculoskeletal disorders(V13.59)     Past Surgical History:  Procedure Laterality Date   MOUTH SURGERY     tooth extraction   SPINE SURGERY      Family History  Problem Relation Age of Onset   Hypertension Mother    Diabetes Father    Hypertension Father    Migraines Father    Cancer Maternal Grandmother        breast    Social History   Socioeconomic History   Marital status: Married    Spouse name: Genise Strack   Number of children: 4   Years of education: 12   Highest education level: Not on file  Occupational History    Comment: not employed  Tobacco Use   Smoking status: Never   Smokeless tobacco: Never  Substance and Sexual Activity   Alcohol use: No   Drug use: No   Sexual activity: Not on file  Other Topics Concern   Not on file  Social History Narrative   Not on file   Social Determinants of Health   Financial Resource Strain: Not on file  Food Insecurity: Not on file  Transportation Needs: Not on file  Physical Activity: Not on file  Stress: Not on file  Social Connections: Not on file  Intimate Partner Violence: Not on file    Outpatient Medications Prior to Visit  Medication Sig Dispense Refill   ascorbic acid (VITAMIN C) 1000 MG tablet Take by mouth.     ZINC ACETATE EX  Apply topically.     Cholecalciferol 75 MCG (3000 UT) TABS Take by mouth.     No facility-administered medications prior to visit.    Allergies  Allergen Reactions   Penicillins     Swelling of hands and feet    Review of Systems CONSTITUTIONAL: Negative for chills, fatigue, fever,  E/N/T: see HPI CARDIOVASCULAR: Negative for chest pain,  RESPIRATORY: see HPI         Objective:   PHYSICAL EXAM:   VS: BP 122/82 (BP Location: Left Arm, Patient Position: Sitting)   Pulse 79   Temp (!) 97.2 F (36.2 C) (Temporal)   Ht _0  (1.6 m)   Wt 150 lb (68 kg)   LMP 08/23/2012   SpO2 99%   BMI 26.57 kg/m   GEN: Well nourished, well developed, in no acute distress  HEENT: normal external ears and nose - normal external auditory canals and TMS -  - Lips, Teeth and Gums - normal  Oropharynx - erythema/pnd noted Cardiac: RRR; no murmurs,  Respiratory: faint rhonchi noted     Health Maintenance Due  Topic Date Due   HIV Screening  Never done   Hepatitis C  Screening  Never done   TETANUS/TDAP  Never done   PAP SMEAR-Modifier  Never done   COLONOSCOPY (Pts 45-4yr Insurance coverage will need to be confirmed)  Never done   Zoster Vaccines- Shingrix (1 of 2) Never done   INFLUENZA VACCINE  Never done    There are no preventive care reminders to display for this patient.   Lab Results  Component Value Date   TSH 0.99 02/10/2021   No results found for: "WBC", "HGB", "HCT", "MCV", "PLT" No results found for: "NA", "K", "CHLORIDE", "CO2", "GLUCOSE", "BUN", "CREATININE", "BILITOT", "ALKPHOS", "AST", "ALT", "PROT", "ALBUMIN", "CALCIUM", "ANIONGAP", "EGFR", "GFR" No results found for: "CHOL" No results found for: "HDL" No results found for: "LDLCALC" No results found for: "TRIG" No results found for: "CHOLHDL" No results found for: "HGBA1C"     Assessment & Plan:   Problem List Items Addressed This Visit   None Visit Diagnoses     Bronchitis    -  Primary    Relevant Medications   azithromycin (ZITHROMAX) 250 MG tablet   predniSONE (DELTASONE) 20 MG tablet Continue mucinex      Meds ordered this encounter  Medications   azithromycin (ZITHROMAX) 250 MG tablet    Sig: Take 2 tablets on day 1, then 1 tablet daily on days 2 through 5    Dispense:  6 tablet    Refill:  0    Order Specific Question:   Supervising Provider    Answer:   CRochel Brome[[[id number]]  predniSONE (DELTASONE) 20 MG tablet    Sig: Take 3 tablets (60 mg total) by mouth daily with breakfast for 3 days, THEN 2 tablets (40 mg total) daily with breakfast for 3 days, THEN 1 tablet (20 mg total) daily with breakfast for 3 days.    Dispense:  18 tablet    Refill:  0    Order Specific Question:   Supervising Provider    Answer:   CShelton Silvas   No orders of the defined types were placed in this encounter.    Follow-up: Return if symptoms worsen or fail to improve.  An After Visit Summary was printed and given to the patient.  [facility] ([phone]

## 2023-04-18 DIAGNOSIS — N132 Hydronephrosis with renal and ureteral calculous obstruction: Secondary | ICD-10-CM | POA: Diagnosis not present

## 2023-05-10 DIAGNOSIS — Z419 Encounter for procedure for purposes other than remedying health state, unspecified: Secondary | ICD-10-CM | POA: Diagnosis not present

## 2023-06-03 ENCOUNTER — Telehealth: Payer: Medicaid Other | Admitting: Physician Assistant

## 2023-06-03 ENCOUNTER — Telehealth: Payer: Self-pay

## 2023-06-03 DIAGNOSIS — U071 COVID-19: Secondary | ICD-10-CM

## 2023-06-03 MED ORDER — NIRMATRELVIR/RITONAVIR (PAXLOVID)TABLET: 3.0000 | ORAL_TABLET | Freq: Two times a day (BID) | ORAL | 0 refills | Status: AC

## 2023-06-03 MED ORDER — BENZONATATE 100 MG PO CAPS: 100.0000 mg | ORAL_CAPSULE | Freq: Three times a day (TID) | ORAL | 0 refills | Status: AC | PRN

## 2023-06-03 NOTE — Progress Notes (Signed)
Virtual Visit Consent   ABBIEGALE Lang, you are scheduled for a virtual visit with a [facility] provider today. Just as with appointments in the office, your consent must be obtained to participate. Your consent will be active for this visit and any virtual visit you may have with one of our providers in the next 365 days. If you have a MyChart account, a copy of this consent can be sent to you electronically.  As this is a virtual visit, video technology does not allow for your provider to perform a traditional examination. This may limit your provider's ability to fully assess your condition. If your provider identifies any concerns that need to be evaluated in person or the need to arrange testing (such as labs, EKG, etc.), we will make arrangements to do so. Although advances in technology are sophisticated, we cannot ensure that it will always work on either your end or our end. If the connection with a video visit is poor, the visit may have to be switched to a telephone visit. With either a video or telephone visit, we are not always able to ensure that we have a secure connection.  By engaging in this virtual visit, you consent to the provision of healthcare and authorize for your insurance to be billed (if applicable) for the services provided during this visit. Depending on your insurance coverage, you may receive a charge related to this service.  I need to obtain your verbal consent now. Are you willing to proceed with your visit today? Kathy Lang has provided verbal consent on 06/03/2023 for a virtual visit (video or telephone). Kathy Loveless, PA-C  Date: 06/03/2023 2:54 PM  Virtual Visit via Video Note   I, Kathy Lang, connected with  Kathy Lang  ([id number], 27-Feb-1966) on 06/03/23 at  2:45 PM EDT by a video-enabled telemedicine application and verified that I am speaking with the correct person using two identifiers.  Location: Patient: Virtual Visit Location  Patient: Home Provider: Virtual Visit Location Provider: Home Office   I discussed the limitations of evaluation and management by telemedicine and the availability of in person appointments. The patient expressed understanding and agreed to proceed.    History of Present Illness: Kathy Lang is a 57 y.o. who identifies as a female who was assigned female at birth, and is being seen today for Covid 3.  HPI: URI  This is a new problem. The current episode started in the past 7 days (Started around Tuesday, 05/31/23). The problem has been gradually worsening. There has been no fever. Associated symptoms include congestion, coughing, headaches, a plugged ear sensation, rhinorrhea, sinus pain and a sore throat. Pertinent negatives include no ear pain. The treatment provided no relief.  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 (Take nirmatrelvir 150 mg two tablets twice daily for 5 days and ritonavir 100 mg one tablet twice daily for 5  days) Patient GFR is 78, Disp: 30 tablet, Rfl: 0   ascorbic acid (VITAMIN C) 1000 MG tablet, Take by mouth., Disp: , Rfl:    ZINC ACETATE EX, Apply topically., Disp: , Rfl:   Observations/Objective: Patient is well-developed, well-nourished in no acute distress.  Resting comfortably at home.  Head is normocephalic, atraumatic.  No labored breathing.  Speech is clear and coherent with logical content.  Patient is alert and oriented at baseline.    Assessment and Plan: 1. COVID-19 - nirmatrelvir/ritonavir (PAXLOVID) 20 x 150 MG & 10 x 100MG  TABS; Take 3 tablets by mouth 2 (two) times daily for 5 days. (Take nirmatrelvir 150 mg two tablets twice daily for 5 days and ritonavir 100 mg one tablet twice daily for 5 days) Patient GFR is 78  Dispense: 30 tablet; Refill: 0 - MyChart COVID-19 home monitoring program; Future - benzonatate (TESSALON) 100 MG capsule; Take 1-2 capsules (100-200 mg total) by mouth 3 (three) times daily as needed.  Dispense: 30 capsule; Refill: 0  - Continue OTC symptomatic management of choice - Will send OTC vitamins and supplement information through AVS - Paxlovid and Tessalon prescribed - Patient enrolled in MyChart symptom monitoring - Push fluids - Rest as needed - Discussed return precautions and when to seek in-person evaluation, sent via AVS as well   Follow Up Instructions: I discussed the assessment and treatment plan with the patient. The patient was provided an opportunity to ask questions and all were answered. The patient agreed with the plan and demonstrated an understanding of the instructions.  A copy of instructions were sent to the patient via MyChart unless otherwise noted below.    The patient was advised to call back or seek an in-person evaluation if the symptoms worsen or if the condition fails to improve as anticipated.    Kathy Loveless, PA-C

## 2023-06-03 NOTE — Telephone Encounter (Signed)
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  Recommend patient do a my chart virtual visit being that she needs to be seen by a provider so they can call her  in medication.

## 2023-06-03 NOTE — Patient Instructions (Signed)
Aleen Sells, thank you for joining [name], PA-C for today's virtual visit.  While this provider is not your primary care provider (PCP), if your PCP is located in our provider database this encounter information will be shared with them immediately following your visit.   A [facility] MyChart account gives you access to today's visit and all your visits, tests, and labs performed at [facility] " click here if you don't have a [facility] MyChart account or go to [email or web address]  Consent: (Patient) Kathy Lang provided verbal consent for this virtual visit at the beginning of the encounter.  Current Medications:  Current Outpatient Medications:    benzonatate (TESSALON) 100 MG capsule, Take 1-2 capsules (100-200 mg total) by mouth 3 (three) times daily as needed., Disp: 30 capsule, Rfl: 0   nirmatrelvir/ritonavir (PAXLOVID) 20 x 150 MG & 10 x 100MG  TABS, Take 3 tablets by mouth 2 (two) times daily for 5 days. (Take nirmatrelvir 150 mg two tablets twice daily for 5 days and ritonavir 100 mg one tablet twice daily for 5 days) Patient GFR is 78, Disp: 30 tablet, Rfl: 0   ascorbic acid (VITAMIN C) 1000 MG tablet, Take by mouth., Disp: , Rfl:    ZINC ACETATE EX, Apply topically., Disp: , Rfl:    Medications ordered in this encounter:  Meds ordered this encounter  Medications   nirmatrelvir/ritonavir (PAXLOVID) 20 x 150 MG & 10 x 100MG  TABS    Sig: Take 3 tablets by mouth 2 (two) times daily for 5 days. (Take nirmatrelvir 150 mg two tablets twice daily for 5 days and ritonavir 100 mg one tablet twice daily for 5 days) Patient GFR is 78    Dispense:  30 tablet    Refill:  0    Order Specific Question:   Supervising Provider    Answer:   Merrilee Jansky [[id number]]   benzonatate (TESSALON) 100 MG capsule    Sig: Take 1-2 capsules (100-200 mg total) by mouth 3 (three) times daily as needed.    Dispense:  30 capsule    Refill:  0    Order Specific Question:    Supervising Provider    Answer:   Merrilee Jansky [id number]     *If you need refills on other medications prior to your next appointment, please contact your pharmacy*  Follow-Up: Call back or seek an in-person evaluation if the symptoms worsen or if the condition fails to improve as anticipated.  [facility] [phone]  Care Instructions: Can take to lessen severity: Vit C 500mg  twice daily Quercertin 250-500mg  twice daily Zinc 75-100mg  daily Melatonin 3-6 mg at bedtime Vit D3 1000-2000 IU daily Aspirin 81 mg daily with food Optional: Famotidine 20mg  daily Also can add tylenol/ibuprofen as needed for fevers and body aches May add Mucinex or Mucinex DM as needed for cough/congestion    Isolation Instructions: You are to isolate at home until you have been fever free for at least 24 hours without a fever-reducing medication, and symptoms have been steadily improving for 24 hours. At that time,  you can end isolation but need to mask for an additional 5 days.   If you must be around other household members who do not have symptoms, you need to make sure that both you and the family members are masking consistently with a high-quality mask.  If you note any worsening of symptoms despite treatment, please seek an in-person evaluation ASAP. If you note any  significant shortness of breath or any chest pain, please seek ER evaluation. Please do not delay care!   COVID-19: What to Do if You Are Sick If you test positive and are an older adult or someone who is at high risk of getting very sick from COVID-19, treatment may be available. Contact a healthcare provider right away after a positive test to determine if you are eligible, even if your symptoms are mild right now. You can also visit a Test to Treat location and, if eligible, receive a prescription from a provider. Don't delay: Treatment must be started within the first few days to be effective. If you have a  fever, cough, or other symptoms, you might have COVID-19. Most people have mild illness and are able to recover at home. If you are sick: Keep track of your symptoms. If you have an emergency warning sign (including trouble breathing), call 911. Steps to help prevent the spread of COVID-19 if you are sick If you are sick with COVID-19 or think you might have COVID-19, follow the steps below to care for yourself and to help protect other people in your home and community. Stay home except to get medical care Stay home. Most people with COVID-19 have mild illness and can recover at home without medical care. Do not leave your home, except to get medical care. Do not visit public areas and do not go to places where you are unable to wear a mask. Take care of yourself. Get rest and stay hydrated. Take over-the-counter medicines, such as acetaminophen, to help you feel better. Stay in touch with your doctor. Call before you get medical care. Be sure to get care if you have trouble breathing, or have any other emergency warning signs, or if you think it is an emergency. Avoid public transportation, ride-sharing, or taxis if possible. Get tested If you have symptoms of COVID-19, get tested. While waiting for test results, stay away from others, including staying apart from those living in your household. Get tested as soon as possible after your symptoms start. Treatments may be available for people with COVID-19 who are at risk for becoming very sick. Don't delay: Treatment must be started early to be effective--some treatments must begin within 5 days of your first symptoms. Contact your healthcare provider right away if your test result is positive to determine if you are eligible. Self-tests are one of several options for testing for the virus that causes COVID-19 and may be more convenient than laboratory-based tests and point-of-care tests. Ask your healthcare provider or your local health department if  you need help interpreting your test results. You can visit your state, tribal, local, and territorial health department's website to look for the latest local information on testing sites. Separate yourself from other people As much as possible, stay in a specific room and away from other people and pets in your home. If possible, you should use a separate bathroom. If you need to be around other people or animals in or outside of the home, wear a well-fitting mask. Tell your close contacts that they may have been exposed to COVID-19. An infected person can spread COVID-19 starting 48 hours (or 2 days) before the person has any symptoms or tests positive. By letting your close contacts know they may have been exposed to COVID-19, you are helping to protect everyone. See COVID-19 and Animals if you have questions about pets. If you are diagnosed with COVID-19, someone from the health department may  call you. Answer the call to slow the spread. Monitor your symptoms Symptoms of COVID-19 include fever, cough, or other symptoms. Follow care instructions from your healthcare provider and local health department. Your local health authorities may give instructions on checking your symptoms and reporting information. When to seek emergency medical attention Look for emergency warning signs* for COVID-19. If someone is showing any of these signs, seek emergency medical care immediately: Trouble breathing Persistent pain or pressure in the chest New confusion Inability to wake or stay awake Pale, gray, or blue-colored skin, lips, or nail beds, depending on skin tone *This list is not all possible symptoms. Please call your medical provider for any other symptoms that are severe or concerning to you. Call 911 or call ahead to your local emergency facility: Notify the operator that you are seeking care for someone who has or may have COVID-19. Call ahead before visiting your doctor Call ahead. Many medical  visits for routine care are being postponed or done by phone or telemedicine. If you have a medical appointment that cannot be postponed, call your doctor's office, and tell them you have or may have COVID-19. This will help the office protect themselves and other patients. If you are sick, wear a well-fitting mask You should wear a mask if you must be around other people or animals, including pets (even at home). Wear a mask with the best fit, protection, and comfort for you. You don't need to wear the mask if you are alone. If you can't put on a mask (because of trouble breathing, for example), cover your coughs and sneezes in some other way. Try to stay at least 6 feet away from other people. This will help protect the people around you. Masks should not be placed on young children under age 70 years, anyone who has trouble breathing, or anyone who is not able to remove the mask without help. Cover your coughs and sneezes Cover your mouth and nose with a tissue when you cough or sneeze. Throw away used tissues in a lined trash can. Immediately wash your hands with soap and water for at least 20 seconds. If soap and water are not available, clean your hands with an alcohol-based hand sanitizer that contains at least 60% alcohol. Clean your hands often Wash your hands often with soap and water for at least 20 seconds. This is especially important after blowing your nose, coughing, or sneezing; going to the bathroom; and before eating or preparing food. Use hand sanitizer if soap and water are not available. Use an alcohol-based hand sanitizer with at least 60% alcohol, covering all surfaces of your hands and rubbing them together until they feel dry. Soap and water are the best option, especially if hands are visibly dirty. Avoid touching your eyes, nose, and mouth with unwashed hands. Handwashing Tips Avoid sharing personal household items Do not share dishes, drinking glasses, cups, eating  utensils, towels, or bedding with other people in your home. Wash these items thoroughly after using them with soap and water or put in the dishwasher. Clean surfaces in your home regularly Clean and disinfect high-touch surfaces (for example, doorknobs, tables, handles, light switches, and countertops) in your "sick room" and bathroom. In shared spaces, you should clean and disinfect surfaces and items after each use by the person who is ill. If you are sick and cannot clean, a caregiver or other person should only clean and disinfect the area around you (such as your bedroom and bathroom) on  an as needed basis. Your caregiver/other person should wait as long as possible (at least several hours) and wear a mask before entering, cleaning, and disinfecting shared spaces that you use. Clean and disinfect areas that may have blood, stool, or body fluids on them. Use household cleaners and disinfectants. Clean visible dirty surfaces with household cleaners containing soap or detergent. Then, use a household disinfectant. Use a product from [employer] List N: Disinfectants for Coronavirus (COVID-19). Be sure to follow the instructions on the label to ensure safe and effective use of the product. Many products recommend keeping the surface wet with a disinfectant for a certain period of time (look at "contact time" on the product label). You may also need to wear personal protective equipment, such as gloves, depending on the directions on the product label. Immediately after disinfecting, wash your hands with soap and water for 20 seconds. For completed guidance on cleaning and disinfecting your home, visit Complete Disinfection Guidance. Take steps to improve ventilation at home Improve ventilation (air flow) at home to help prevent from spreading COVID-19 to other people in your household. Clear out COVID-19 virus particles in the air by opening windows, using air filters, and turning on fans in your  home. Use this interactive tool to learn how to improve air flow in your home. When you can be around others after being sick with COVID-19 Deciding when you can be around others is different for different situations. Find out when you can safely end home isolation. For any additional questions about your care, contact your healthcare provider or state or local health department. 10/28/2020 Content source: [facility] ([facility]), Division of Viral Diseases This information is not intended to replace advice given to you by your health care provider. Make sure you discuss any questions you have with your health care provider. Document Revised: 12/11/2020 Document Reviewed: 12/11/2020 Elsevier Patient Education  2022 [employer].     If you have been instructed to have an in-person evaluation today at a local Urgent Care facility, please use the link below. It will take you to a list of all of our available [facility], including address, phone number and hours of operation. Please do not delay care.  [facility]  If you or a family member do not have a primary care provider, use the link below to schedule a visit and establish care. When you choose a [facility] primary care physician or advanced practice provider, you gain a long-term partner in health. Find a Primary Care Provider  Learn more about [facility]'s in-office and virtual care options: [facility] - Get Care Now

## 2023-06-10 DIAGNOSIS — Z419 Encounter for procedure for purposes other than remedying health state, unspecified: Secondary | ICD-10-CM | POA: Diagnosis not present

## 2023-06-15 DIAGNOSIS — D485 Neoplasm of uncertain behavior of skin: Secondary | ICD-10-CM | POA: Diagnosis not present

## 2023-06-22 DIAGNOSIS — N898 Other specified noninflammatory disorders of vagina: Secondary | ICD-10-CM | POA: Diagnosis not present

## 2023-06-22 DIAGNOSIS — Z1331 Encounter for screening for depression: Secondary | ICD-10-CM | POA: Diagnosis not present

## 2023-06-22 DIAGNOSIS — Z6822 Body mass index (BMI) 22.0-22.9, adult: Secondary | ICD-10-CM | POA: Diagnosis not present

## 2023-07-10 DIAGNOSIS — Z419 Encounter for procedure for purposes other than remedying health state, unspecified: Secondary | ICD-10-CM | POA: Diagnosis not present

## 2023-07-14 DIAGNOSIS — L209 Atopic dermatitis, unspecified: Secondary | ICD-10-CM | POA: Diagnosis not present

## 2023-07-14 DIAGNOSIS — L810 Postinflammatory hyperpigmentation: Secondary | ICD-10-CM | POA: Diagnosis not present

## 2023-07-14 DIAGNOSIS — L299 Pruritus, unspecified: Secondary | ICD-10-CM | POA: Diagnosis not present

## 2023-07-14 DIAGNOSIS — L280 Lichen simplex chronicus: Secondary | ICD-10-CM | POA: Diagnosis not present

## 2023-08-09 DIAGNOSIS — R5383 Other fatigue: Secondary | ICD-10-CM | POA: Diagnosis not present

## 2023-08-09 DIAGNOSIS — R0683 Snoring: Secondary | ICD-10-CM | POA: Diagnosis not present

## 2023-08-09 DIAGNOSIS — Z6823 Body mass index (BMI) 23.0-23.9, adult: Secondary | ICD-10-CM | POA: Diagnosis not present

## 2023-08-09 DIAGNOSIS — Z13220 Encounter for screening for lipoid disorders: Secondary | ICD-10-CM | POA: Diagnosis not present

## 2023-08-09 DIAGNOSIS — Z1211 Encounter for screening for malignant neoplasm of colon: Secondary | ICD-10-CM | POA: Diagnosis not present

## 2023-08-09 DIAGNOSIS — Z01419 Encounter for gynecological examination (general) (routine) without abnormal findings: Secondary | ICD-10-CM | POA: Diagnosis not present

## 2024-07-27 ENCOUNTER — Ambulatory Visit (HOSPITAL_BASED_OUTPATIENT_CLINIC_OR_DEPARTMENT_OTHER): Admit: 2024-07-27 | Discharge: 2024-07-27 | Disposition: A | Admitting: Radiology

## 2024-07-27 ENCOUNTER — Ambulatory Visit (HOSPITAL_BASED_OUTPATIENT_CLINIC_OR_DEPARTMENT_OTHER)
Admission: EM | Admit: 2024-07-27 | Discharge: 2024-07-27 | Disposition: A | Discharge status: Home / Self Care | Attending: Family Medicine | Admitting: Family Medicine

## 2024-07-27 ENCOUNTER — Ambulatory Visit (HOSPITAL_BASED_OUTPATIENT_CLINIC_OR_DEPARTMENT_OTHER): Payer: Self-pay | Admitting: Family Medicine

## 2024-07-27 ENCOUNTER — Encounter (HOSPITAL_BASED_OUTPATIENT_CLINIC_OR_DEPARTMENT_OTHER): Payer: Self-pay

## 2024-07-27 DIAGNOSIS — R051 Acute cough: Secondary | ICD-10-CM | POA: Diagnosis not present

## 2024-07-27 DIAGNOSIS — R071 Chest pain on breathing: Secondary | ICD-10-CM

## 2024-07-27 NOTE — ED Triage Notes (Addendum)
 Fever, nausea, bilateral ankle pain onset Wednesday. States no fever since last night. No lung disease, no cardiac history. States feeling tightness to upper back with deep inspiration. Occ cough. No nasal drainage or congestion. Patient declining covid/flu testing. Concerned for pneumonia.

## 2024-07-27 NOTE — Discharge Instructions (Signed)
 I am not seeing anything specific on your x-ray.  I will call you if the radiologist reads this as anything like pneumonia.  Otherwise this could be just something viral.  Recommend over-the-counter medicines for your symptoms as needed. Rest, hydrate and follow-up as needed

## 2024-07-27 NOTE — ED Provider Notes (Signed)
 " [facility]    CSN: [id number] Arrival date & time: 07/27/24  1103      History   Chief Complaint Chief Complaint  Patient presents with   nausea   Joint Pain   Fever    HPI Kathy Lang is a 58 y.o. female.   Fever, nausea, bilateral ankle pain onset Wednesday. States no fever since last night. No lung disease, no cardiac history. States feeling tightness to upper back with deep inspiration. Occ cough. No nasal drainage or congestion. Patient declining covid/flu testing. Concerned for pneumonia.    Fever   Past Medical History:  Diagnosis Date   Cervicalgia 01/16/2013   Dr MALVA [facility] referred to [facility] for neurosurgical evaluation.    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      Home Medications    Prior to Admission medications  Medication Sig Start Date End Date Taking? Authorizing Provider  ascorbic acid (VITAMIN C) 1000 MG tablet Take by mouth.    [provider]  benzonatate  (TESSALON ) 100 MG capsule Take 1-2 capsules (100-200 mg total) by mouth 3 (three) times daily as needed. 06/03/23   Vivienne Delon HERO, PA-C  ZINC ACETATE EX Apply topically.    [provider]    Family History Family  History  Problem Relation Age of Onset   Hypertension Mother    Diabetes Father    Hypertension Father    Migraines Father    Cancer Maternal Grandmother        breast    Social History Social History[1]   Allergies   Penicillins   Review of Systems Review of Systems  Constitutional:  Positive for fever.     Physical Exam Triage Vital Signs ED Triage Vitals  Encounter Vitals Group     BP 07/27/24 1130 125/86     Girls Systolic BP Percentile --      Girls Diastolic BP Percentile --      Boys Systolic BP Percentile --      Boys Diastolic BP Percentile --      Pulse Rate 07/27/24 1130 82     Resp 07/27/24 1130 20     Temp 07/27/24 1130 98.7 F (37.1 C)     Temp Source 07/27/24 1130 Oral     SpO2 07/27/24 1130 98 %     Weight --      Height --      Head Circumference --      Peak Flow --      Pain Score 07/27/24 1132 6     Pain Loc --      Pain Education --      Exclude from Growth Chart --  No data found.  Updated Vital Signs BP 125/86 (BP Location: Right Arm)   Pulse 82   Temp 98.7 F (37.1 C) (Oral)   Resp 20   LMP 08/23/2012   SpO2 98%   Visual Acuity Right Eye Distance:   Left Eye Distance:   Bilateral Distance:    Right Eye Near:   Left Eye Near:    Bilateral Near:     Physical Exam Constitutional:      General: She is not in acute distress.    Appearance: Normal appearance. She is not ill-appearing, toxic-appearing or diaphoretic.  HENT:     Head: Normocephalic and atraumatic.     Right Ear: Tympanic membrane and ear canal normal.     Left Ear: Tympanic membrane and ear canal normal.     Nose: Congestion and rhinorrhea present.     Mouth/Throat:     Pharynx: Oropharynx is clear.  Eyes:     Conjunctiva/sclera: Conjunctivae normal.  Cardiovascular:     Rate and Rhythm: Normal rate and regular rhythm.     Pulses: Normal pulses.     Heart sounds: Normal heart sounds.  Pulmonary:     Effort: Pulmonary effort is normal.     Breath  sounds: Normal breath sounds.  Skin:    General: Skin is warm and dry.  Neurological:     Mental Status: She is alert.  Psychiatric:        Mood and Affect: Mood normal.      UC Treatments / Results  Labs (all labs ordered are listed, but only abnormal results are displayed) Labs Reviewed - No data to display  EKG   Radiology DG Chest 2 View Result Date: 07/27/2024 EXAM: 2 VIEW(S) XRAY OF THE CHEST 07/27/2024 11:50:01 AM COMPARISON: 09/19/2020 CLINICAL HISTORY: pain with inspiration FINDINGS: LUNGS AND PLEURA: No focal pulmonary opacity. No pleural effusion. No pneumothorax. HEART AND MEDIASTINUM: No acute abnormality of the cardiac and mediastinal silhouettes. BONES AND SOFT TISSUES: Fusion hardware in lower cervical spine, partially imaged. No acute osseous abnormality. IMPRESSION: 1. No acute cardiopulmonary process. Electronically signed by: Waddell Calk MD 07/27/2024 01:58 PM EST RP Workstation: [id number]    Procedures Procedures (including critical care time)  Medications Ordered in UC Medications - No data to display  Initial Impression / Assessment and Plan / UC Course  I have reviewed the triage vital signs and the nursing notes.  Pertinent labs & imaging results that were available during my care of the patient were reviewed by me and considered in my medical decision making (see chart for details).     Chest pain with breathing and acute cough.  No concerns on x-ray.  Most likely this is something viral.  Over-the-counter medications for symptoms as needed.  Rest, hydrate and follow-up as needed Final Clinical Impressions(s) / UC Diagnoses   Final diagnoses:  Chest pain varying with breathing  Acute cough     Discharge Instructions      I am not seeing anything specific on your x-ray.  I will call you if the radiologist reads this as anything like pneumonia.  Otherwise this could be just something viral.  Recommend over-the-counter medicines for your  symptoms as needed. Rest, hydrate and follow-up as needed     ED Prescriptions   None    PDMP not reviewed this encounter.     [1]  Social History Tobacco Use   Smoking status: Never   Smokeless tobacco: Never  Substance Use Topics  Alcohol use: No   Drug use: No     Adah Wilbert LABOR, FNP 07/28/24 1059  "
